# Patient Record
Sex: Female | Born: 1980 | Hispanic: No | Marital: Married | Smoking: Never smoker
Health system: Southern US, Community
[De-identification: ages and names within clinical notes are randomized; demographics above are authoritative.]

---

## 2008-11-10 ENCOUNTER — Encounter: Admission: RE | Admit: 2008-11-10 | Discharge: 2008-11-10 | Payer: Self-pay | Admitting: Obstetrics and Gynecology

## 2010-07-07 ENCOUNTER — Encounter: Admission: RE | Admit: 2010-07-07 | Discharge: 2010-07-07 | Payer: Self-pay | Admitting: Obstetrics and Gynecology

## 2011-02-02 ENCOUNTER — Other Ambulatory Visit: Payer: Self-pay | Admitting: Obstetrics and Gynecology

## 2011-02-02 DIAGNOSIS — N63 Unspecified lump in unspecified breast: Secondary | ICD-10-CM

## 2011-02-10 ENCOUNTER — Ambulatory Visit
Admission: RE | Admit: 2011-02-10 | Discharge: 2011-02-10 | Disposition: A | Discharge status: Home / Self Care | Payer: BC Managed Care – PPO | Source: Ambulatory Visit | Attending: Obstetrics and Gynecology | Admitting: Obstetrics and Gynecology

## 2011-02-10 DIAGNOSIS — N63 Unspecified lump in unspecified breast: Secondary | ICD-10-CM

## 2013-04-11 ENCOUNTER — Ambulatory Visit: Payer: Self-pay | Admitting: Internal Medicine

## 2013-04-19 ENCOUNTER — Ambulatory Visit (INDEPENDENT_AMBULATORY_CARE_PROVIDER_SITE_OTHER): Payer: BC Managed Care – PPO | Admitting: Internal Medicine

## 2013-04-19 ENCOUNTER — Encounter: Payer: Self-pay | Admitting: Internal Medicine

## 2013-04-19 VITALS — BP 120/60 | HR 64 | Temp 98.2°F | Resp 16 | Ht 63.0 in | Wt 115.0 lb

## 2013-04-19 DIAGNOSIS — R490 Dysphonia: Secondary | ICD-10-CM | POA: Insufficient documentation

## 2013-04-19 DIAGNOSIS — R109 Unspecified abdominal pain: Secondary | ICD-10-CM | POA: Insufficient documentation

## 2013-04-19 DIAGNOSIS — Z0000 Encounter for general adult medical examination without abnormal findings: Secondary | ICD-10-CM

## 2013-04-19 MED ORDER — FOLIC ACID 400 MCG PO TABS
400.0000 ug | ORAL_TABLET | Freq: Every day | ORAL | Status: DC
Start: 2013-04-19 — End: 2014-04-23

## 2013-04-19 MED ORDER — LORATADINE 10 MG PO TABS: 10.0000 mg | ORAL_TABLET | Freq: Every day | ORAL | Status: DC

## 2013-04-19 MED ORDER — RANITIDINE HCL 150 MG PO TABS: 150.0000 mg | ORAL_TABLET | Freq: Every day | ORAL | Status: DC

## 2013-04-19 MED ORDER — VITAMIN D 1000 UNITS PO TABS: 1000.0000 [IU] | ORAL_TABLET | Freq: Every day | ORAL | Status: AC

## 2013-04-19 NOTE — Assessment & Plan Note (Signed)
Voice rest prn Drink more water Empiric loratidine or ranitidine ENT consult was offered

## 2013-04-19 NOTE — Patient Instructions (Addendum)
Gluten free trial (no wheat products) x4-6 weeks. OK to use Gluten-free bread and pasta. Milk free trial (no milk, ice cream and yogurt) x4 weeks. OK to use almond or soy milk. 

## 2013-04-19 NOTE — Progress Notes (Signed)
  Subjective:    Patient ID: Robin Graham, female    DOB: May 23, 1981, 32 y.o.   MRN: [id number]  Abdominal Pain This is a new problem. The current episode started more than 1 month ago (6 months). The onset quality is gradual. The problem occurs intermittently. The problem has been gradually improving (80% better). The pain is located in the RLQ and suprapubic region. The pain is severe. The quality of the pain is sharp. The abdominal pain does not radiate. Pertinent negatives include no anorexia, arthralgias, constipation, diarrhea, dysuria, fever, flatus, frequency, headaches, hematochezia, hematuria, melena, nausea or weight loss. Exacerbated by: intercourse. The pain is relieved by being still. Treatments tried: she was treated by her GYN for yeast infection. The treatment provided significant relief. There is no history of abdominal surgery, Crohn's disease, gallstones, GERD, irritable bowel syndrome, pancreatitis, PUD or ulcerative colitis.    New pt  C/o hoarseness C/o pain in lower abd x6 mo  Review of Systems  Constitutional: Negative for fever, chills, weight loss, activity change, appetite change, fatigue and unexpected weight change.  HENT: Positive for voice change. Negative for nosebleeds, congestion, sore throat, rhinorrhea, sneezing, mouth sores, trouble swallowing and sinus pressure.        Hoarsness x 6 months  Eyes: Negative for visual disturbance.  Respiratory: Negative for cough and chest tightness.   Gastrointestinal: Positive for abdominal pain. Negative for nausea, diarrhea, constipation, melena, hematochezia, anorexia and flatus.  Genitourinary: Positive for pelvic pain. Negative for dysuria, urgency, frequency, hematuria, flank pain, vaginal bleeding, vaginal discharge, difficulty urinating and vaginal pain.  Musculoskeletal: Negative for back pain, arthralgias and gait problem.  Skin: Negative for pallor and rash.  Neurological: Negative for dizziness, tremors,  syncope, weakness, numbness and headaches.  Psychiatric/Behavioral: Negative for confusion and sleep disturbance. The patient is not nervous/anxious and is not hyperactive.        Objective:   Physical Exam  Constitutional: She appears well-developed. No distress.  HENT:  Head: Normocephalic.  Right Ear: External ear normal.  Left Ear: External ear normal.  Nose: Nose normal.  Mouth/Throat: Oropharynx is clear and moist. No oropharyngeal exudate.  Dry mouth, a little hoarse  Eyes: Conjunctivae are normal. Pupils are equal, round, and reactive to light. Right eye exhibits no discharge. Left eye exhibits no discharge.  Neck: Normal range of motion. Neck supple. No JVD present. No tracheal deviation present. No thyromegaly present.  Cardiovascular: Normal rate, regular rhythm and normal heart sounds.   Pulmonary/Chest: No stridor. No respiratory distress. She has no wheezes.  Abdominal: Soft. Bowel sounds are normal. She exhibits no distension and no mass. There is tenderness. There is no rebound and no guarding.  Sensitive in RLQ and below umbilicus - mild No mass  Musculoskeletal: She exhibits no edema and no tenderness.  Lymphadenopathy:    She has no cervical adenopathy.  Neurological: She displays normal reflexes. No cranial nerve deficit. She exhibits normal muscle tone. Coordination normal.  Skin: No rash noted. No erythema.  Psychiatric: She has a normal mood and affect. Her behavior is normal. Judgment and thought content normal.          Assessment & Plan:

## 2013-04-19 NOTE — Assessment & Plan Note (Signed)
8/14 x6 months, now is 80% better ?etiology S/p GYN exam Labs ordered Transvag US vs abd CT discussed RTC 108mo - call if not well

## 2013-05-10 ENCOUNTER — Other Ambulatory Visit (INDEPENDENT_AMBULATORY_CARE_PROVIDER_SITE_OTHER): Payer: BC Managed Care – PPO

## 2013-05-10 DIAGNOSIS — Z0000 Encounter for general adult medical examination without abnormal findings: Secondary | ICD-10-CM

## 2013-05-10 DIAGNOSIS — R490 Dysphonia: Secondary | ICD-10-CM

## 2013-05-10 DIAGNOSIS — R109 Unspecified abdominal pain: Secondary | ICD-10-CM

## 2013-05-10 LAB — HEPATIC FUNCTION PANEL
Albumin: 3.9 g/dL (ref 3.5–5.2)
Alkaline Phosphatase: 48 U/L (ref 39–117)
Bilirubin, Direct: 0.1 mg/dL (ref 0.0–0.3)
Total Bilirubin: 1 mg/dL (ref 0.3–1.2)

## 2013-05-10 LAB — SEDIMENTATION RATE: Sed Rate: 12 mm/hr (ref 0–22)

## 2013-05-10 LAB — BASIC METABOLIC PANEL
BUN: 8 mg/dL (ref 6–23)
CO2: 26 mEq/L (ref 19–32)
Chloride: 106 mEq/L (ref 96–112)
Creatinine, Ser: 0.9 mg/dL (ref 0.4–1.2)
Glucose, Bld: 86 mg/dL (ref 70–99)

## 2013-05-10 LAB — CBC WITH DIFFERENTIAL/PLATELET
Basophils Relative: 0.7 % (ref 0.0–3.0)
Eosinophils Absolute: 0.3 10*3/uL (ref 0.0–0.7)
MCHC: 34 g/dL (ref 30.0–36.0)
MCV: 84.2 fl (ref 78.0–100.0)
Monocytes Absolute: 0.6 10*3/uL (ref 0.1–1.0)
Neutrophils Relative %: 52 % (ref 43.0–77.0)
RBC: 4.24 Mil/uL (ref 3.87–5.11)
RDW: 14.6 % (ref 11.5–14.6)

## 2013-05-10 LAB — URINALYSIS, ROUTINE W REFLEX MICROSCOPIC
Bilirubin Urine: NEGATIVE
Ketones, ur: NEGATIVE
Total Protein, Urine: NEGATIVE
pH: 6 (ref 5.0–8.0)

## 2013-05-10 LAB — LIPID PANEL
LDL Cholesterol: 120 mg/dL — ABNORMAL HIGH (ref 0–99)
Total CHOL/HDL Ratio: 3

## 2013-05-14 ENCOUNTER — Ambulatory Visit (INDEPENDENT_AMBULATORY_CARE_PROVIDER_SITE_OTHER): Payer: BC Managed Care – PPO | Admitting: Internal Medicine

## 2013-05-14 ENCOUNTER — Encounter: Payer: Self-pay | Admitting: Internal Medicine

## 2013-05-14 ENCOUNTER — Ambulatory Visit (INDEPENDENT_AMBULATORY_CARE_PROVIDER_SITE_OTHER)
Admission: RE | Admit: 2013-05-14 | Discharge: 2013-05-14 | Disposition: A | Discharge status: Home / Self Care | Payer: BC Managed Care – PPO | Source: Ambulatory Visit | Attending: Internal Medicine | Admitting: Internal Medicine

## 2013-05-14 VITALS — BP 120/88 | HR 72 | Resp 16 | Wt 114.0 lb

## 2013-05-14 DIAGNOSIS — M545 Low back pain, unspecified: Secondary | ICD-10-CM | POA: Insufficient documentation

## 2013-05-14 DIAGNOSIS — M546 Pain in thoracic spine: Secondary | ICD-10-CM | POA: Insufficient documentation

## 2013-05-14 MED ORDER — IBUPROFEN 600 MG PO TABS: 600.0000 mg | ORAL_TABLET | Freq: Two times a day (BID) | ORAL | Status: DC | PRN

## 2013-05-14 MED ORDER — CYCLOBENZAPRINE HCL 5 MG PO TABS: 2.5000 mg | ORAL_TABLET | Freq: Three times a day (TID) | ORAL | Status: DC | PRN

## 2013-05-14 NOTE — Assessment & Plan Note (Addendum)
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 

## 2013-05-14 NOTE — Progress Notes (Signed)
Subjective:   C/o LS and thoracic back pain since 32 y.o started at the [facility] when dancing instructions got much more intense...   Back Pain This is a recurrent problem. The current episode started more than 1 year ago (since 32 yo). The problem occurs daily. The problem has been waxing and waning since onset. The pain is present in the lumbar spine and thoracic spine. The quality of the pain is described as cramping, stabbing and burning. The pain does not radiate. The pain is at a severity of 7/10. The pain is severe. The pain is worse during the day. The symptoms are aggravated by bending, twisting and standing. Associated symptoms include abdominal pain and pelvic pain. Pertinent negatives include no dysuria, fever, headaches, numbness, weakness or weight loss. She has tried chiropractic manipulation (massage) for the symptoms. The treatment provided mild (rest helps) relief.  Abdominal Pain This is a new problem. The current episode started more than 1 month ago (6 months). The onset quality is gradual. The problem occurs intermittently. The problem has been gradually improving (80% better). The pain is located in the RLQ and suprapubic region. The pain is severe. The quality of the pain is sharp. The abdominal pain does not radiate. Pertinent negatives include no anorexia, arthralgias, constipation, diarrhea, dysuria, fever, flatus, frequency, headaches, hematochezia, hematuria, melena, nausea or weight loss. Exacerbated by: intercourse. The pain is relieved by being still. Treatments tried: she was treated by her GYN for yeast infection. The treatment provided significant relief. There is no history of abdominal surgery, Crohn's disease, gallstones, GERD, irritable bowel syndrome, pancreatitis, PUD or ulcerative colitis.     Review of Systems  Constitutional: Negative for fever, chills, weight loss, activity change, appetite change, fatigue and unexpected weight change.  HENT: Positive  for voice change. Negative for nosebleeds, congestion, sore throat, rhinorrhea, sneezing, mouth sores, trouble swallowing and sinus pressure.        Hoarsness x 6 months  Eyes: Negative for visual disturbance.  Respiratory: Negative for cough and chest tightness.   Gastrointestinal: Positive for abdominal pain. Negative for nausea, diarrhea, constipation, melena, hematochezia, anorexia and flatus.  Genitourinary: Positive for pelvic pain. Negative for dysuria, urgency, frequency, hematuria, flank pain, vaginal bleeding, vaginal discharge, difficulty urinating and vaginal pain.  Musculoskeletal: Positive for back pain. Negative for arthralgias and gait problem.  Skin: Negative for pallor and rash.  Neurological: Negative for dizziness, tremors, syncope, weakness, numbness and headaches.  Psychiatric/Behavioral: Negative for confusion and sleep disturbance. The patient is not nervous/anxious and is not hyperactive.        Objective:   Physical Exam  Constitutional: She appears well-developed. No distress.  HENT:  Head: Normocephalic.  Right Ear: External ear normal.  Left Ear: External ear normal.  Nose: Nose normal.  Mouth/Throat: Oropharynx is clear and moist. No oropharyngeal exudate.  Dry mouth, a little hoarse  Eyes: Conjunctivae are normal. Pupils are equal, round, and reactive to light. Right eye exhibits no discharge. Left eye exhibits no discharge.  Neck: Normal range of motion. Neck supple. No JVD present. No tracheal deviation present. No thyromegaly present.  Cardiovascular: Normal rate, regular rhythm and normal heart sounds.   Pulmonary/Chest: No stridor. No respiratory distress. She has no wheezes.  Abdominal: Soft. Bowel sounds are normal. She exhibits no distension and no mass. There is no tenderness. There is no rebound and no guarding.  Sensitive in RLQ and below umbilicus - mild No mass  Musculoskeletal: She exhibits tenderness. She exhibits no edema.  Thor and LS  spine are tender over the middle and laterally Full ROM  Lymphadenopathy:    She has no cervical adenopathy.  Neurological: She displays normal reflexes. No cranial nerve deficit. She exhibits normal muscle tone. Coordination normal.  Skin: No rash noted. No erythema.  Psychiatric: She has a normal mood and affect. Her behavior is normal. Judgment and thought content normal.          Assessment & Plan:

## 2013-05-14 NOTE — Assessment & Plan Note (Addendum)
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

## 2013-05-14 NOTE — Patient Instructions (Addendum)
[facility]

## 2013-05-20 ENCOUNTER — Ambulatory Visit (INDEPENDENT_AMBULATORY_CARE_PROVIDER_SITE_OTHER): Payer: BC Managed Care – PPO | Admitting: Family Medicine

## 2013-05-20 ENCOUNTER — Encounter: Payer: Self-pay | Admitting: Family Medicine

## 2013-05-20 VITALS — BP 98/74 | HR 53 | Wt 116.0 lb

## 2013-05-20 DIAGNOSIS — M999 Biomechanical lesion, unspecified: Secondary | ICD-10-CM

## 2013-05-20 DIAGNOSIS — M546 Pain in thoracic spine: Secondary | ICD-10-CM

## 2013-05-20 DIAGNOSIS — M9981 Other biomechanical lesions of cervical region: Secondary | ICD-10-CM

## 2013-05-20 DIAGNOSIS — M545 Low back pain, unspecified: Secondary | ICD-10-CM

## 2013-05-20 NOTE — Progress Notes (Signed)
  I'm seeing this patient by the request  of:  Dr. Posey Rea.  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 Patient states that the pain is a daily occurrence. Patient states that the severity can change going from her from 3/10-7/10. Patient describes the pain is more of a dull aching sensation that can have a stabbing and burning character to it. Patient denies any radiation around the chest denies any radiation down the lower Ms. Patient states it seems to get worse during the day especially during dancing. Patient states certain movements such as bending twisting and standing for a long amount of time can be painful. Patient denies any fever, chills, dysuria, abnormal menses, weakness in the large cavities or any abnormal weight loss. Patient has tried chiropractic manipulation, massage and over-the-counter medications without significant improvement.  Past medical, surgical, family and social history reviewed. Medications reviewed all in the electronic medical record.  Review of Systems: No headache, visual changes, nausea, vomiting, diarrhea, constipation, dizziness, abdominal pain, skin rash, fevers, chills, night sweats, weight loss, swollen lymph nodes, joint swelling,  chest pain, shortness of breath, mood changes.   Objective:    Blood pressure 98/74, pulse 53, weight 116 lb (52.617 kg), last menstrual period 05/14/2013, SpO2 97.00%.   General: No apparent distress alert and oriented x3 mood and affect normal, dressed appropriately.  HEENT: Pupils equal, extraocular movements intact Respiratory: Patient's speak in full sentences and does not appear short of breath Cardiovascular: No lower extremity edema, non tender, no erythema Skin: Warm dry intact with no signs of infection or rash on extremities or on axial skeleton. Abdomen: Soft nontender Neuro:  Cranial nerves II through XII are intact, neurovascularly intact in all extremities with 2+ DTRs and 2+ pulses. Lymph: No lymphadenopathy of posterior or anterior cervical chain or axillae bilaterally.  Gait normal with good balance and coordination.  MSK: Non tender with full range of motion and good stability and symmetric strength and tone of shoulders, elbows, wrist, hip, knee and ankles bilaterally.  Back Exam:  Inspection: Unremarkable  Motion: Flexion 45 deg, Extension 45 deg, Side Bending to 45 deg bilaterally,  Rotation to 45 deg bilaterally  SLR laying: Negative  XSLR laying: Negative  Palpable tenderness: None. FABER: negative. Sensory change: Gross sensation intact to all lumbar and sacral dermatomes.  Reflexes: 2+ at both patellar tendons, 2+ at achilles tendons, Babinski's downgoing.  Strength at foot  Plantar-flexion: 5/5 Dorsi-flexion: 5/5 Eversion: 5/5 Inversion: 5/5  Leg strength  Quad: 5/5 Hamstring: 5/5 Hip flexor: 5/5 Hip abductors: 5/5  Gait unremarkable.  OMT Physical Exam  Standing structural       Occiput nuetral  Shoulder left higher  Inferior angle of scapula left higher  Illiac crest right higher  Medial malleolus nuetral  Standing flexion nuetral  Seated Flexion right  Cervical  C4 F RS right  Thoracic T1 E RS right, with elevated first rib T4 E RS left  Lumbar L2 F RS left  Sacrum Right on right  Illium nuetral    Impression and Recommendations:     This case required medical decision making of moderate complexity.

## 2013-05-20 NOTE — Assessment & Plan Note (Signed)
Exposure reviewed and interpreted by me today. Patient has normal spine alignment. I think this is more secondary to patient's occupation. Patient does have some muscle imbalances with upper thoracic spine musculature being weaker than middle back. Patient given exercises today and did respond well to manipulation. Patient will have vitamin D as well as fish oil to her regular regimen to Discussed the importance of stretching See patient back again in 3 weeks.

## 2013-05-20 NOTE — Assessment & Plan Note (Signed)
Decision today to treat with OMT was based on Physical Exam  After verbal consent patient was treated with HVLA and ME techniques in cervical, thoracic and lumbar areas  Patient tolerated the procedure well with improvement in symptoms  Patient given exercises, stretches and lifestyle modifications  Recommended fish oil and vitamin D. Patient would like to avoid prescription medications.   Patient will follow up in 3 weeks

## 2013-05-20 NOTE — Assessment & Plan Note (Signed)
Decision today to treat with OMT was based on Physical Exam  After verbal consent patient was treated with HVLA and ME techniques in cervical, thoracic and lumbar areas  Patient tolerated the procedure well with improvement in symptoms  Patient given exercises, stretches and lifestyle modifications  Recommended fish oil and vitamin D. Patient would like to avoid prescription medications.   Patient will follow up in 3 weeks  

## 2013-05-20 NOTE — Assessment & Plan Note (Signed)
Multifactorial and mostly muscular in nature. Patient has stronger lower back which is likely compensating for her weaker midback. Patient given exercises as well as core strengthening exercises that could be beneficial. Discussed hip flexors and keeping them stretched. Patient will start fish oil and vitamin D. Responded well to osteopathic manipulation Will return in 3 weeks.

## 2013-05-20 NOTE — Patient Instructions (Signed)
Very nice to meet you Focus on strengthen the upper back  Do the 5# weight at 45 degree bend and pull back to pinch shoulder blades Sacroiliac Joint Mobilization and Rehab 1. Work on pretzel stretching, shoulder back and leg draped in front. 3-5 sets, 30 sec.. 2. hip abductor rotations. standing, hip flexion and rotation outward then inward. 3 sets, 15 reps. when can do comfortably, add ankle weights starting at 2 pounds.  3. cross over stretching - shoulder back to ground, same side leg crossover. 3-5 sets for 30 min..  4. rolling up and back knees to chest and rocking. 5. sacral tilt - 5 sets, hold for 5-10 seconds Also focus on your hip flexors Focus on core stability training Fish oil 2-3grams daily Vit D 1000IU daily Come back and see me again in 3 weeks.

## 2013-06-10 ENCOUNTER — Encounter: Payer: Self-pay | Admitting: Family Medicine

## 2013-06-10 ENCOUNTER — Ambulatory Visit (INDEPENDENT_AMBULATORY_CARE_PROVIDER_SITE_OTHER): Payer: BC Managed Care – PPO | Admitting: Family Medicine

## 2013-06-10 VITALS — BP 100/72 | HR 58 | Wt 117.0 lb

## 2013-06-10 DIAGNOSIS — M545 Low back pain, unspecified: Secondary | ICD-10-CM

## 2013-06-10 DIAGNOSIS — M546 Pain in thoracic spine: Secondary | ICD-10-CM

## 2013-06-10 DIAGNOSIS — M9981 Other biomechanical lesions of cervical region: Secondary | ICD-10-CM

## 2013-06-10 DIAGNOSIS — M999 Biomechanical lesion, unspecified: Secondary | ICD-10-CM

## 2013-06-10 NOTE — Assessment & Plan Note (Signed)
Decision today to treat with OMT was based on Physical Exam  After verbal consent patient was treated with HVLA, ME techniques in cervical, thoracic, lumbar and sacrum areas  Patient tolerated the procedure well with improvement in symptoms  Patient given exercises, stretches and lifestyle modifications  See medications in patient instructions if given  Patient will follow up in 3-4 weeks  

## 2013-06-10 NOTE — Assessment & Plan Note (Signed)
Patient is still having muscular skeletal complaints. Patient is not doing the exercises on a regular basis or taking the tissue well or vitamin D. Patient told that she needs to do this on a regular basis at least 3 times a week. Patient is responding to osteopathic manipulation. Patient will come back again in 3-4 weeks for further evaluation

## 2013-06-10 NOTE — Patient Instructions (Signed)
It is good to see you Continue the exercises at least 3 times a week.  Yoga would be good I do think it would be good to se eyou again in 3-4 weeks.  Be patient this did not start over night and it will not get better overnight.

## 2013-06-10 NOTE — Progress Notes (Signed)
  Subjective:    CC: Thoracic and low back pain  HPI: Patient is a very pleasant 32 year old female: Upper thoracic and low back pain. 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 Patient did have osteopathic manipulation and did respond well. Patient's was given home exercises focusing on stretching and strengthening of the core. Patient states that she is better. Patient feels that she did respond well to the osteopathic manipulation. 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 Patient denies any new symptoms such as radiation of pain, any numbness or tingling. Patient still says the pain mostly in her neck and back seem to be worse. This does not keep her up at night. Patient did start to fish oil and vitamin D. but not on a regular basis.  Past medical history, Surgical history, Family history not pertinant except as noted below, Social history, Allergies, and medications have been entered into the medical record, reviewed, and no changes needed.   Review of Systems: No fevers, chills, night sweats, weight loss, chest pain, or shortness of breath.   Objective:   Blood pressure 100/72, pulse 58, weight 117 lb (53.071 kg), last menstrual period 05/14/2013.  General: Well Developed, well nourished, and in no acute distress.  Neuro: Alert and oriented x3, extra-ocular muscles intact, sensation grossly intact.  HEENT: Normocephalic, atraumatic, pupils equal round reactive to light, neck supple, no masses, no lymphadenopathy, thyroid nonpalpable.  Skin: Warm and dry, no rashes. Cardiac:  no lower extremity edema. Respiratory: Not using accessory muscles, speaking in full sentences. Abdominal: NT, soft Gait: Nonantlagic, good balance and coordination Lymphatic: no lymphadenopathy in neck or axillae on palpation, non tender.  Musculoskeletal: Inspection and palpation of the right and left upper extremities including the shoulders elbows  and wrist are unremarkable with full range of motion and good muscle strength and tone. Inspection and palpation of the right and left lower extremities including the hips knees and ankles are unremarkable and nontender with full range of motion and good muscle strength and tone and are symmetric.  Back Exam:  Inspection: Unremarkable  Motion: Flexion 45 deg, Extension 45 deg, Side Bending to 45 deg bilaterally,  Rotation to 45 deg bilaterally  SLR laying: Negative  XSLR laying: Negative  Palpable tenderness: None.  FABER: negative.  Sensory change: Gross sensation intact to all lumbar and sacral dermatomes.  Reflexes: 2+ at both patellar tendons, 2+ at achilles tendons, Babinski's downgoing.  Strength at foot  Plantar-flexion: 5/5 Dorsi-flexion: 5/5 Eversion: 5/5 Inversion: 5/5  Leg strength  Quad: 5/5 Hamstring: 5/5 Hip flexor: 5/5 Hip abductors: 5/5  Gait unremarkable.  Neck: Inspection unremarkable. No palpable stepoffs. Negative Spurling's maneuver. Full neck range of motion Grip strength and sensation normal in bilateral hands Strength good C4 to T1 distribution No sensory change to C4 to T1 Negative Hoffman sign bilaterally Reflexes normal  OMT Physical Exam  Standing structural  Occiput nuetral  Shoulder left higher  Inferior angle of scapula left higher  Illiac crest right higher  Medial malleolus nuetral  Standing flexion nuetral  Seated Flexion right  Cervical  C2 F RS left C4 F RS right   Thoracic  T1 E RS right, T4 E RS left   Lumbar  L2 F RS left   Sacrum  Right on right   Illium  nuetral    Impression and Recommendations:

## 2013-06-10 NOTE — Assessment & Plan Note (Signed)
Still secondary to tight hip flexors. Patient shown proper technique of the stretching again. Patient will follow up again in 3-4 weeks for further evaluation.

## 2013-06-10 NOTE — Assessment & Plan Note (Signed)
Decision today to treat with OMT was based on Physical Exam  After verbal consent patient was treated with HVLA, ME techniques in cervical, thoracic, lumbar and sacrum areas  Patient tolerated the procedure well with improvement in symptoms  Patient given exercises, stretches and lifestyle modifications  See medications in patient instructions if given  Patient will follow up in 3-4 weeks

## 2013-08-14 ENCOUNTER — Encounter: Payer: Self-pay | Admitting: Internal Medicine

## 2013-08-14 ENCOUNTER — Ambulatory Visit (INDEPENDENT_AMBULATORY_CARE_PROVIDER_SITE_OTHER): Payer: BC Managed Care – PPO | Admitting: Internal Medicine

## 2013-08-14 VITALS — BP 108/80 | HR 68 | Temp 98.2°F | Resp 16 | Wt 117.0 lb

## 2013-08-14 DIAGNOSIS — M545 Low back pain, unspecified: Secondary | ICD-10-CM

## 2013-08-14 DIAGNOSIS — R490 Dysphonia: Secondary | ICD-10-CM

## 2013-08-14 DIAGNOSIS — R109 Unspecified abdominal pain: Secondary | ICD-10-CM

## 2013-08-14 NOTE — Assessment & Plan Note (Signed)
ENT consult if problems

## 2013-08-14 NOTE — Assessment & Plan Note (Signed)
F/u w/Dr Smith 

## 2013-08-14 NOTE — Progress Notes (Signed)
Subjective:   F/u LS and thoracic back pain (since 32 y.o started at the [facility] when dancing instructions got much more intense); better after Dr Michaelle Copas treatments  Wt Readings from Last 3 Encounters:  08/14/13 117 lb (53.071 kg)  06/10/13 117 lb (53.071 kg)  05/20/13 116 lb (52.617 kg)   BP Readings from Last 3 Encounters:  08/14/13 108/80  06/10/13 100/72  05/20/13 98/74      Back Pain This is a recurrent problem. The current episode started more than 1 year ago (since 32 yo). The problem occurs daily. The problem has been waxing and waning since onset. The pain is present in the lumbar spine and thoracic spine. The quality of the pain is described as cramping, stabbing and burning. The pain does not radiate. The pain is at a severity of 7/10. The pain is severe. The pain is worse during the day. The symptoms are aggravated by bending, twisting and standing. Associated symptoms include abdominal pain and pelvic pain. Pertinent negatives include no dysuria, fever, headaches, numbness, weakness or weight loss. She has tried chiropractic manipulation (massage) for the symptoms. The treatment provided mild (rest helps) relief.  Abdominal Pain This is a new problem. The current episode started more than 1 month ago (6 months). The onset quality is gradual. The problem occurs intermittently. The problem has been gradually improving (80% better). The pain is located in the RLQ and suprapubic region. The pain is severe. The quality of the pain is sharp. The abdominal pain does not radiate. Pertinent negatives include no anorexia, arthralgias, constipation, diarrhea, dysuria, fever, flatus, frequency, headaches, hematochezia, hematuria, melena, nausea or weight loss. Exacerbated by: intercourse. The pain is relieved by being still. Treatments tried: she was treated by her GYN for yeast infection. The treatment provided significant relief. There is no history of abdominal surgery, Crohn's  disease, gallstones, GERD, irritable bowel syndrome, pancreatitis, PUD or ulcerative colitis.     Review of Systems  Constitutional: Negative for fever, chills, weight loss, activity change, appetite change, fatigue and unexpected weight change.  HENT: Positive for voice change. Negative for congestion, mouth sores, nosebleeds, rhinorrhea, sinus pressure, sneezing, sore throat and trouble swallowing.        Hoarsness x 6 months  Eyes: Negative for visual disturbance.  Respiratory: Negative for cough and chest tightness.   Gastrointestinal: Positive for abdominal pain. Negative for nausea, diarrhea, constipation, melena, hematochezia, anorexia and flatus.  Genitourinary: Positive for pelvic pain. Negative for dysuria, urgency, frequency, hematuria, flank pain, vaginal bleeding, vaginal discharge, difficulty urinating and vaginal pain.  Musculoskeletal: Positive for back pain. Negative for arthralgias and gait problem.  Skin: Negative for pallor and rash.  Neurological: Negative for dizziness, tremors, syncope, weakness, numbness and headaches.  Psychiatric/Behavioral: Negative for confusion and sleep disturbance. The patient is not nervous/anxious and is not hyperactive.        Objective:   Physical Exam  Constitutional: She appears well-developed. No distress.  HENT:  Head: Normocephalic.  Right Ear: External ear normal.  Left Ear: External ear normal.  Nose: Nose normal.  Mouth/Throat: Oropharynx is clear and moist. No oropharyngeal exudate.  Dry mouth, a little hoarse  Eyes: Conjunctivae are normal. Pupils are equal, round, and reactive to light. Right eye exhibits no discharge. Left eye exhibits no discharge.  Neck: Normal range of motion. Neck supple. No JVD present. No tracheal deviation present. No thyromegaly present.  Cardiovascular: Normal rate, regular rhythm and normal heart sounds.   Pulmonary/Chest: No stridor. No respiratory  distress. She has no wheezes.  Abdominal:  Soft. Bowel sounds are normal. She exhibits no distension and no mass. There is no tenderness. There is no rebound and no guarding.  Sensitive in RLQ and below umbilicus - mild No mass  Musculoskeletal: She exhibits tenderness. She exhibits no edema.  Thor and LS spine are tender over the middle and laterally Full ROM  Lymphadenopathy:    She has no cervical adenopathy.  Neurological: She displays normal reflexes. No cranial nerve deficit. She exhibits normal muscle tone. Coordination normal.  Skin: No rash noted. No erythema.  Psychiatric: She has a normal mood and affect. Her behavior is normal. Judgment and thought content normal.    Lab Results  Component Value Date   WBC 6.2 05/10/2013   HGB 12.1 05/10/2013   HCT 35.6* 05/10/2013   PLT 165.0 05/10/2013   GLUCOSE 86 05/10/2013   CHOL 196 05/10/2013   TRIG 68.0 05/10/2013   HDL 62.30 05/10/2013   LDLCALC 120* 05/10/2013   ALT 20 05/10/2013   AST 23 05/10/2013   NA 137 05/10/2013   K 3.7 05/10/2013   CL 106 05/10/2013   CREATININE 0.9 05/10/2013   BUN 8 05/10/2013   CO2 26 05/10/2013   TSH 2.93 05/10/2013        Assessment & Plan:

## 2013-08-14 NOTE — Patient Instructions (Signed)
   Milk free trial (no milk, ice cream, cheese and yogurt) for 4-6 weeks. OK to use almond, coconut, rice or soy milk. "Almond breeze" brand tastes good.  

## 2013-08-14 NOTE — Assessment & Plan Note (Signed)
Better  

## 2013-08-14 NOTE — Progress Notes (Signed)
Pre visit review using our clinic review tool, if applicable. No additional management support is needed unless otherwise documented below in the visit note. 

## 2013-10-21 ENCOUNTER — Ambulatory Visit: Payer: BC Managed Care – PPO | Admitting: Internal Medicine

## 2013-11-27 ENCOUNTER — Encounter: Payer: Self-pay | Admitting: Internal Medicine

## 2013-11-27 ENCOUNTER — Ambulatory Visit (INDEPENDENT_AMBULATORY_CARE_PROVIDER_SITE_OTHER): Payer: BC Managed Care – PPO | Admitting: Internal Medicine

## 2013-11-27 ENCOUNTER — Other Ambulatory Visit: Payer: Self-pay | Admitting: *Deleted

## 2013-11-27 ENCOUNTER — Other Ambulatory Visit (INDEPENDENT_AMBULATORY_CARE_PROVIDER_SITE_OTHER): Payer: BC Managed Care – PPO

## 2013-11-27 VITALS — BP 98/76 | HR 72 | Temp 98.1°F | Resp 16 | Wt 117.0 lb

## 2013-11-27 DIAGNOSIS — R102 Pelvic and perineal pain: Secondary | ICD-10-CM

## 2013-11-27 DIAGNOSIS — R109 Unspecified abdominal pain: Secondary | ICD-10-CM

## 2013-11-27 DIAGNOSIS — N949 Unspecified condition associated with female genital organs and menstrual cycle: Secondary | ICD-10-CM

## 2013-11-27 LAB — CBC WITH DIFFERENTIAL/PLATELET
BASOS ABS: 0 10*3/uL (ref 0.0–0.1)
Basophils Relative: 0.6 % (ref 0.0–3.0)
EOS ABS: 0.2 10*3/uL (ref 0.0–0.7)
Eosinophils Relative: 4.2 % (ref 0.0–5.0)
HEMATOCRIT: 37.7 % (ref 36.0–46.0)
HEMOGLOBIN: 12.5 g/dL (ref 12.0–15.0)
LYMPHS ABS: 1.7 10*3/uL (ref 0.7–4.0)
Lymphocytes Relative: 33 % (ref 12.0–46.0)
MCHC: 33.2 g/dL (ref 30.0–36.0)
MCV: 84.9 fl (ref 78.0–100.0)
MONO ABS: 0.5 10*3/uL (ref 0.1–1.0)
MONOS PCT: 9.1 % (ref 3.0–12.0)
NEUTROS ABS: 2.7 10*3/uL (ref 1.4–7.7)
Neutrophils Relative %: 53.1 % (ref 43.0–77.0)
PLATELETS: 180 10*3/uL (ref 150.0–400.0)
RBC: 4.44 Mil/uL (ref 3.87–5.11)
RDW: 13.9 % (ref 11.5–14.6)
WBC: 5.1 10*3/uL (ref 4.5–10.5)

## 2013-11-27 LAB — BASIC METABOLIC PANEL
BUN: 16 mg/dL (ref 6–23)
CALCIUM: 9.4 mg/dL (ref 8.4–10.5)
CO2: 27 meq/L (ref 19–32)
CREATININE: 0.9 mg/dL (ref 0.4–1.2)
Chloride: 104 mEq/L (ref 96–112)
GFR: 80.17 mL/min (ref >60.00)
Glucose, Bld: 88 mg/dL (ref 70–99)
Potassium: 4.5 mEq/L (ref 3.5–5.1)
SODIUM: 136 meq/L (ref 135–145)

## 2013-11-27 LAB — URINALYSIS
BILIRUBIN URINE: NEGATIVE
HGB URINE DIPSTICK: NEGATIVE
KETONES UR: NEGATIVE
Leukocytes, UA: NEGATIVE
Nitrite: NEGATIVE
PH: 6 (ref 5.0–8.0)
Specific Gravity, Urine: 1.025 (ref 1.000–1.030)
TOTAL PROTEIN, URINE-UPE24: NEGATIVE
URINE GLUCOSE: NEGATIVE
Urobilinogen, UA: 0.2 (ref 0.0–1.0)

## 2013-11-27 LAB — SEDIMENTATION RATE: Sed Rate: 9 mm/hr (ref 0–22)

## 2013-11-27 MED ORDER — DOXYCYCLINE HYCLATE 100 MG PO TABS: 100.0000 mg | ORAL_TABLET | Freq: Two times a day (BID) | ORAL | Status: DC

## 2013-11-27 NOTE — Assessment & Plan Note (Signed)
8/14 x 6 months, GI related/epigastric - now is 80-90% better

## 2013-11-27 NOTE — Progress Notes (Signed)
Subjective:   C/o recurrent pelvic pain x2 years off and on L>R. Married x 3 years. Living together w/her husband x 4 years. Pain w/intercourse x2 years off and on. No fever. No sweats. 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  Previous epigastric/GI pain in the abdomen have resolved  F/u LS and thoracic back pain (since 33 [facility] much more intense); better after Dr Michaelle CopasSmith's treatments - better  Wt Readings from Last 3 Encounters:  11/27/13 117 lb (53.071 kg)  08/14/13 117 lb (53.071 kg)  06/10/13 117 lb (53.071 kg)   BP Readings from Last 3 Encounters:  11/27/13 98/76  08/14/13 108/80  06/10/13 100/72      HPI   Review of Systems  Constitutional: Negative for chills, activity change, appetite change, fatigue and unexpected weight change.  HENT: Positive for voice change. Negative for congestion, mouth sores, nosebleeds, rhinorrhea, sinus pressure, sneezing, sore throat and trouble swallowing.        Hoarsness x 6 months  Eyes: Negative for visual disturbance.  Respiratory: Negative for cough and chest tightness.   Gastrointestinal: Positive for abdominal pain. Negative for nausea and vomiting.  Genitourinary: Positive for vaginal pain and pelvic pain. Negative for urgency, flank pain, vaginal bleeding, vaginal discharge, difficulty urinating and menstrual problem.  Musculoskeletal: Positive for back pain (better). Negative for arthralgias and gait problem.  Skin: Negative for pallor and rash.  Neurological: Negative for dizziness, tremors and syncope.  Hematological: Does not bruise/bleed easily.  Psychiatric/Behavioral: Negative for suicidal ideas, behavioral problems, confusion, sleep disturbance and dysphoric mood. The patient is not nervous/anxious and is not hyperactive.        Objective:   Physical Exam  Constitutional: She appears well-developed. No distress.  HENT:  Head: Normocephalic.  Right Ear: External  ear normal.  Left Ear: External ear normal.  Nose: Nose normal.  Mouth/Throat: Oropharynx is clear and moist. No oropharyngeal exudate.  Dry mouth, a little hoarse  Eyes: Conjunctivae are normal. Pupils are equal, round, and reactive to light. Right eye exhibits no discharge. Left eye exhibits no discharge.  Neck: Normal range of motion. Neck supple. No JVD present. No tracheal deviation present. No thyromegaly present.  Cardiovascular: Normal rate, regular rhythm and normal heart sounds.   Pulmonary/Chest: No stridor. No respiratory distress. She has no wheezes.  Abdominal: Soft. Bowel sounds are normal. She exhibits no distension and no mass. There is tenderness (LLQ is sensitive). There is no rebound and no guarding.  Sensitive in RLQ and below umbilicus - mild No mass  Genitourinary:  NE  Musculoskeletal: She exhibits no edema and no tenderness.     Lymphadenopathy:    She has no cervical adenopathy.  Neurological: She displays normal reflexes. No cranial nerve deficit. She exhibits normal muscle tone. Coordination normal.  Skin: No rash noted. No erythema.  Psychiatric: She has a normal mood and affect. Her behavior is normal. Judgment and thought content normal.    Lab Results  Component Value Date   WBC 5.1 11/27/2013   HGB 12.5 11/27/2013   HCT 37.7 11/27/2013   PLT 180.0 11/27/2013   GLUCOSE 88 11/27/2013   CHOL 196 05/10/2013   TRIG 68.0 05/10/2013   HDL 62.30 05/10/2013   LDLCALC 120* 05/10/2013   ALT 20 05/10/2013   AST 23 05/10/2013   NA 136 11/27/2013   K 4.5 11/27/2013   CL 104 11/27/2013   CREATININE 0.9 11/27/2013   BUN 16 11/27/2013  CO2 27 11/27/2013   TSH 2.93 05/10/2013        Assessment & Plan:

## 2013-11-27 NOTE — Progress Notes (Signed)
Pre visit review using our clinic review tool, if applicable. No additional management support is needed unless otherwise documented below in the visit note. 

## 2013-11-27 NOTE — Assessment & Plan Note (Addendum)
Recurrent x2 years. Married x 3 years. Living together w/her husband x 4 years. Pain w/intercourse x2 years off and on. 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  Labs incl. Chlamydia/GC UA probe Doxy x3 wks Gyn consult Dr Juliene PinaMody

## 2013-11-27 NOTE — Progress Notes (Deleted)
  C/o recurrent pelvic pain x2 years off and on L>R. Married x 3 years. Living together w/her husband x 4 years. Pain w/intercourse x2 years off and on. No fever. No sweats. 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

## 2013-11-28 LAB — GC/CHLAMYDIA PROBE AMP, URINE
CHLAMYDIA, SWAB/URINE, PCR: NEGATIVE
GC Probe Amp, Urine: NEGATIVE

## 2013-12-25 ENCOUNTER — Ambulatory Visit: Payer: BC Managed Care – PPO | Admitting: Internal Medicine

## 2014-01-16 ENCOUNTER — Encounter: Payer: Self-pay | Admitting: Internal Medicine

## 2014-01-16 ENCOUNTER — Other Ambulatory Visit (INDEPENDENT_AMBULATORY_CARE_PROVIDER_SITE_OTHER): Payer: BC Managed Care – PPO

## 2014-01-16 ENCOUNTER — Ambulatory Visit (INDEPENDENT_AMBULATORY_CARE_PROVIDER_SITE_OTHER): Payer: BC Managed Care – PPO | Admitting: Internal Medicine

## 2014-01-16 ENCOUNTER — Ambulatory Visit: Payer: BC Managed Care – PPO

## 2014-01-16 VITALS — BP 94/78 | HR 56 | Temp 97.5°F | Ht 63.0 in | Wt 116.0 lb

## 2014-01-16 DIAGNOSIS — M545 Low back pain, unspecified: Secondary | ICD-10-CM

## 2014-01-16 DIAGNOSIS — N949 Unspecified condition associated with female genital organs and menstrual cycle: Secondary | ICD-10-CM

## 2014-01-16 DIAGNOSIS — R490 Dysphonia: Secondary | ICD-10-CM

## 2014-01-16 DIAGNOSIS — R109 Unspecified abdominal pain: Secondary | ICD-10-CM

## 2014-01-16 DIAGNOSIS — M546 Pain in thoracic spine: Secondary | ICD-10-CM

## 2014-01-16 DIAGNOSIS — R102 Pelvic and perineal pain: Secondary | ICD-10-CM

## 2014-01-16 LAB — CBC WITH DIFFERENTIAL/PLATELET
BASOS PCT: 1 % (ref 0.0–3.0)
Basophils Absolute: 0 10*3/uL (ref 0.0–0.1)
EOS ABS: 0.2 10*3/uL (ref 0.0–0.7)
Eosinophils Relative: 4.4 % (ref 0.0–5.0)
HEMATOCRIT: 37.3 % (ref 36.0–46.0)
HEMOGLOBIN: 12.5 g/dL (ref 12.0–15.0)
LYMPHS ABS: 1.7 10*3/uL (ref 0.7–4.0)
Lymphocytes Relative: 34.9 % (ref 12.0–46.0)
MCHC: 33.6 g/dL (ref 30.0–36.0)
MCV: 84.7 fl (ref 78.0–100.0)
MONO ABS: 0.4 10*3/uL (ref 0.1–1.0)
Monocytes Relative: 9 % (ref 3.0–12.0)
NEUTROS ABS: 2.4 10*3/uL (ref 1.4–7.7)
Neutrophils Relative %: 50.7 % (ref 43.0–77.0)
Platelets: 173 10*3/uL (ref 150.0–400.0)
RBC: 4.4 Mil/uL (ref 3.87–5.11)
RDW: 14.7 % (ref 11.5–15.5)
WBC: 4.7 10*3/uL (ref 4.0–10.5)

## 2014-01-16 LAB — VITAMIN B12: Vitamin B-12: 387 pg/mL (ref 211–911)

## 2014-01-16 LAB — HEPATIC FUNCTION PANEL
ALBUMIN: 4.3 g/dL (ref 3.5–5.2)
ALK PHOS: 43 U/L (ref 39–117)
ALT: 33 U/L (ref 0–35)
AST: 42 U/L — ABNORMAL HIGH (ref 0–37)
BILIRUBIN DIRECT: 0.1 mg/dL (ref 0.0–0.3)
TOTAL PROTEIN: 7.2 g/dL (ref 6.0–8.3)
Total Bilirubin: 0.8 mg/dL (ref 0.2–1.2)

## 2014-01-16 LAB — IBC PANEL
Iron: 130 ug/dL (ref 42–145)
Saturation Ratios: 26.8 % (ref 20.0–50.0)
Transferrin: 346.8 mg/dL (ref 212.0–360.0)

## 2014-01-16 LAB — SEDIMENTATION RATE: Sed Rate: 8 mm/hr (ref 0–22)

## 2014-01-16 LAB — TSH: TSH: 2.88 u[IU]/mL (ref 0.35–4.50)

## 2014-01-16 LAB — T4, FREE: Free T4: 0.92 ng/dL (ref 0.60–1.60)

## 2014-01-16 MED ORDER — MELOXICAM 7.5 MG PO TABS: 7.5000 mg | ORAL_TABLET | Freq: Every day | ORAL | Status: DC

## 2014-01-16 MED ORDER — DOXYCYCLINE HYCLATE 100 MG PO TABS: 100.0000 mg | ORAL_TABLET | Freq: Two times a day (BID) | ORAL | Status: DC

## 2014-01-16 MED ORDER — TRAMADOL HCL 50 MG PO TABS: 25.0000 mg | ORAL_TABLET | Freq: Two times a day (BID) | ORAL | Status: DC | PRN

## 2014-01-16 NOTE — Progress Notes (Signed)
Pre visit review using our clinic review tool, if applicable. No additional management support is needed unless otherwise documented below in the visit note. 

## 2014-01-16 NOTE — Assessment & Plan Note (Signed)
Continue with current prn prescription therapy as reflected on the Med list. Better

## 2014-01-16 NOTE — Assessment & Plan Note (Addendum)
60% better s/p pelvic US and GYN eval  Will repeat Doxy x 3 wks and Meloxicam 7.5 mg x 1-2 mo pc  Tramadol prn

## 2014-01-16 NOTE — Progress Notes (Signed)
Subjective:   F/u recurrent pelvic pain x2 years off and on L>R.  Married x 3 years. Living together w/her husband x 4 years. Pain w/intercourse x2 years off and on. No fever. No sweats. 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 Pain is 60% better after Doxy x 3 wks. She s/p recent nl transvaginal pelvic US and GYN evaluation. F/u occ GERD  Hoarse at times  Previous epigastric/GI pain in the abdomen have almost resolved  F/u LS and thoracic back pain (since 33 y.o [facility] more intense); better after Dr Michaelle CopasSmith's treatments - better  Wt Readings from Last 3 Encounters:  01/16/14 116 lb (52.617 kg)  11/27/13 117 lb (53.071 kg)  08/14/13 117 lb (53.071 kg)   BP Readings from Last 3 Encounters:  01/16/14 94/78  11/27/13 98/76  08/14/13 108/80      HPI   Review of Systems  Constitutional: Negative for chills, activity change, appetite change, fatigue and unexpected weight change.  HENT: Positive for voice change. Negative for congestion, mouth sores, nosebleeds, rhinorrhea, sinus pressure, sneezing, sore throat and trouble swallowing.        Hoarsness x 6 months  Eyes: Negative for visual disturbance.  Respiratory: Negative for cough and chest tightness.   Gastrointestinal: Positive for abdominal pain. Negative for nausea and vomiting.  Genitourinary: Positive for vaginal pain and pelvic pain. Negative for urgency, flank pain, vaginal bleeding, vaginal discharge, difficulty urinating and menstrual problem.  Musculoskeletal: Positive for back pain (better). Negative for arthralgias and gait problem.  Skin: Negative for pallor and rash.  Neurological: Negative for dizziness, tremors and syncope.  Hematological: Does not bruise/bleed easily.  Psychiatric/Behavioral: Negative for suicidal ideas, behavioral problems, confusion, sleep disturbance and dysphoric mood. The patient is not nervous/anxious and is not hyperactive.         Objective:   Physical Exam  Constitutional: She appears well-developed. No distress.  HENT:  Head: Normocephalic.  Right Ear: External ear normal.  Left Ear: External ear normal.  Nose: Nose normal.  Mouth/Throat: Oropharynx is clear and moist. No oropharyngeal exudate.  Dry mouth, a little hoarse  Eyes: Conjunctivae are normal. Pupils are equal, round, and reactive to light. Right eye exhibits no discharge. Left eye exhibits no discharge.  Neck: Normal range of motion. Neck supple. No JVD present. No tracheal deviation present. No thyromegaly present.  Cardiovascular: Normal rate, regular rhythm and normal heart sounds.   Pulmonary/Chest: No stridor. No respiratory distress. She has no wheezes.  Abdominal: Soft. Bowel sounds are normal. She exhibits no distension and no mass. There is tenderness (LLQ is sensitive). There is no rebound and no guarding.  Sensitive in RLQ and below umbilicus - mild No mass  Genitourinary:  NE  Musculoskeletal: She exhibits no edema and no tenderness.     Lymphadenopathy:    She has no cervical adenopathy.  Neurological: She displays normal reflexes. No cranial nerve deficit. She exhibits normal muscle tone. Coordination normal.  Skin: No rash noted. No erythema.  Psychiatric: She has a normal mood and affect. Her behavior is normal. Judgment and thought content normal.    Lab Results  Component Value Date   WBC 5.1 11/27/2013   HGB 12.5 11/27/2013   HCT 37.7 11/27/2013   PLT 180.0 11/27/2013   GLUCOSE 88 11/27/2013   CHOL 196 05/10/2013   TRIG 68.0 05/10/2013   HDL 62.30 05/10/2013   LDLCALC 120* 05/10/2013   ALT 20 05/10/2013  AST 23 05/10/2013   NA 136 11/27/2013   K 4.5 11/27/2013   CL 104 11/27/2013   CREATININE 0.9 11/27/2013   BUN 16 11/27/2013   CO2 27 11/27/2013   TSH 2.93 05/10/2013        Assessment & Plan:

## 2014-01-16 NOTE — Assessment & Plan Note (Signed)
Better  

## 2014-01-16 NOTE — Assessment & Plan Note (Addendum)
  H pylori and other labs

## 2014-01-16 NOTE — Assessment & Plan Note (Signed)
Voice rest 

## 2014-01-17 LAB — ALLERGY PROFILE REGION II-DC, DE, MD, ~~LOC~~, VA
Allergen, D pternoyssinus,d7: 2.58 kU/L — ABNORMAL HIGH
Alternaria Alternata: <0.1 kU/L
Aspergillus fumigatus, m3: <0.1 kU/L
Bermuda Grass: 2.19 kU/L — ABNORMAL HIGH
Box Elder IgE: 0.11 kU/L — ABNORMAL HIGH
COMMON RAGWEED: 0.17 kU/L — AB
Cat Dander: 24.6 kU/L — ABNORMAL HIGH
Cockroach: 0.51 kU/L — ABNORMAL HIGH
D. farinae: 4.64 kU/L — ABNORMAL HIGH
Dog Dander: 1.06 kU/L — ABNORMAL HIGH
ELM IGE: 0.13 kU/L — AB
JOHNSON GRASS: 3.35 kU/L — AB
Lamb's Quarters: 0.19 kU/L — ABNORMAL HIGH
MEADOW GRASS: 9.54 kU/L — AB
OAK CLASS: 0.11 kU/L — AB
Pecan/Hickory Tree IgE: <0.1 kU/L

## 2014-01-17 LAB — VITAMIN D 25 HYDROXY (VIT D DEFICIENCY, FRACTURES): Vit D, 25-Hydroxy: 39 ng/mL (ref 30–89)

## 2014-01-17 LAB — ALLERGEN FOOD PROFILE SPECIFIC IGE
CORN: 0.11 kU/L — AB
Fish Cod: <0.1 kU/L
IgE (Immunoglobulin E), Serum: 185 IU/mL — ABNORMAL HIGH (ref 0.0–180.0)
Milk IgE: <0.1 kU/L
Orange: <0.1 kU/L
Peanut IgE: <0.1 kU/L
Soybean IgE: <0.1 kU/L
Tuna IgE: <0.1 kU/L
Wheat IgE: 1 kU/L — ABNORMAL HIGH

## 2014-01-17 LAB — H. PYLORI ANTIBODY, IGG: H Pylori IgG: 0.56 {ISR}

## 2014-02-12 ENCOUNTER — Ambulatory Visit: Payer: BC Managed Care – PPO | Admitting: Internal Medicine

## 2014-02-14 ENCOUNTER — Ambulatory Visit: Payer: BC Managed Care – PPO | Admitting: Internal Medicine

## 2014-03-04 IMAGING — CR DG THORACIC SPINE 2V
3 series · 3 of 3 positions shown · non-contrast
Comparison: None.

CLINICAL DATA: Thoracic back pain

THORACIC SPINE - 2 VIEW

[view not recorded (1 of 3)]
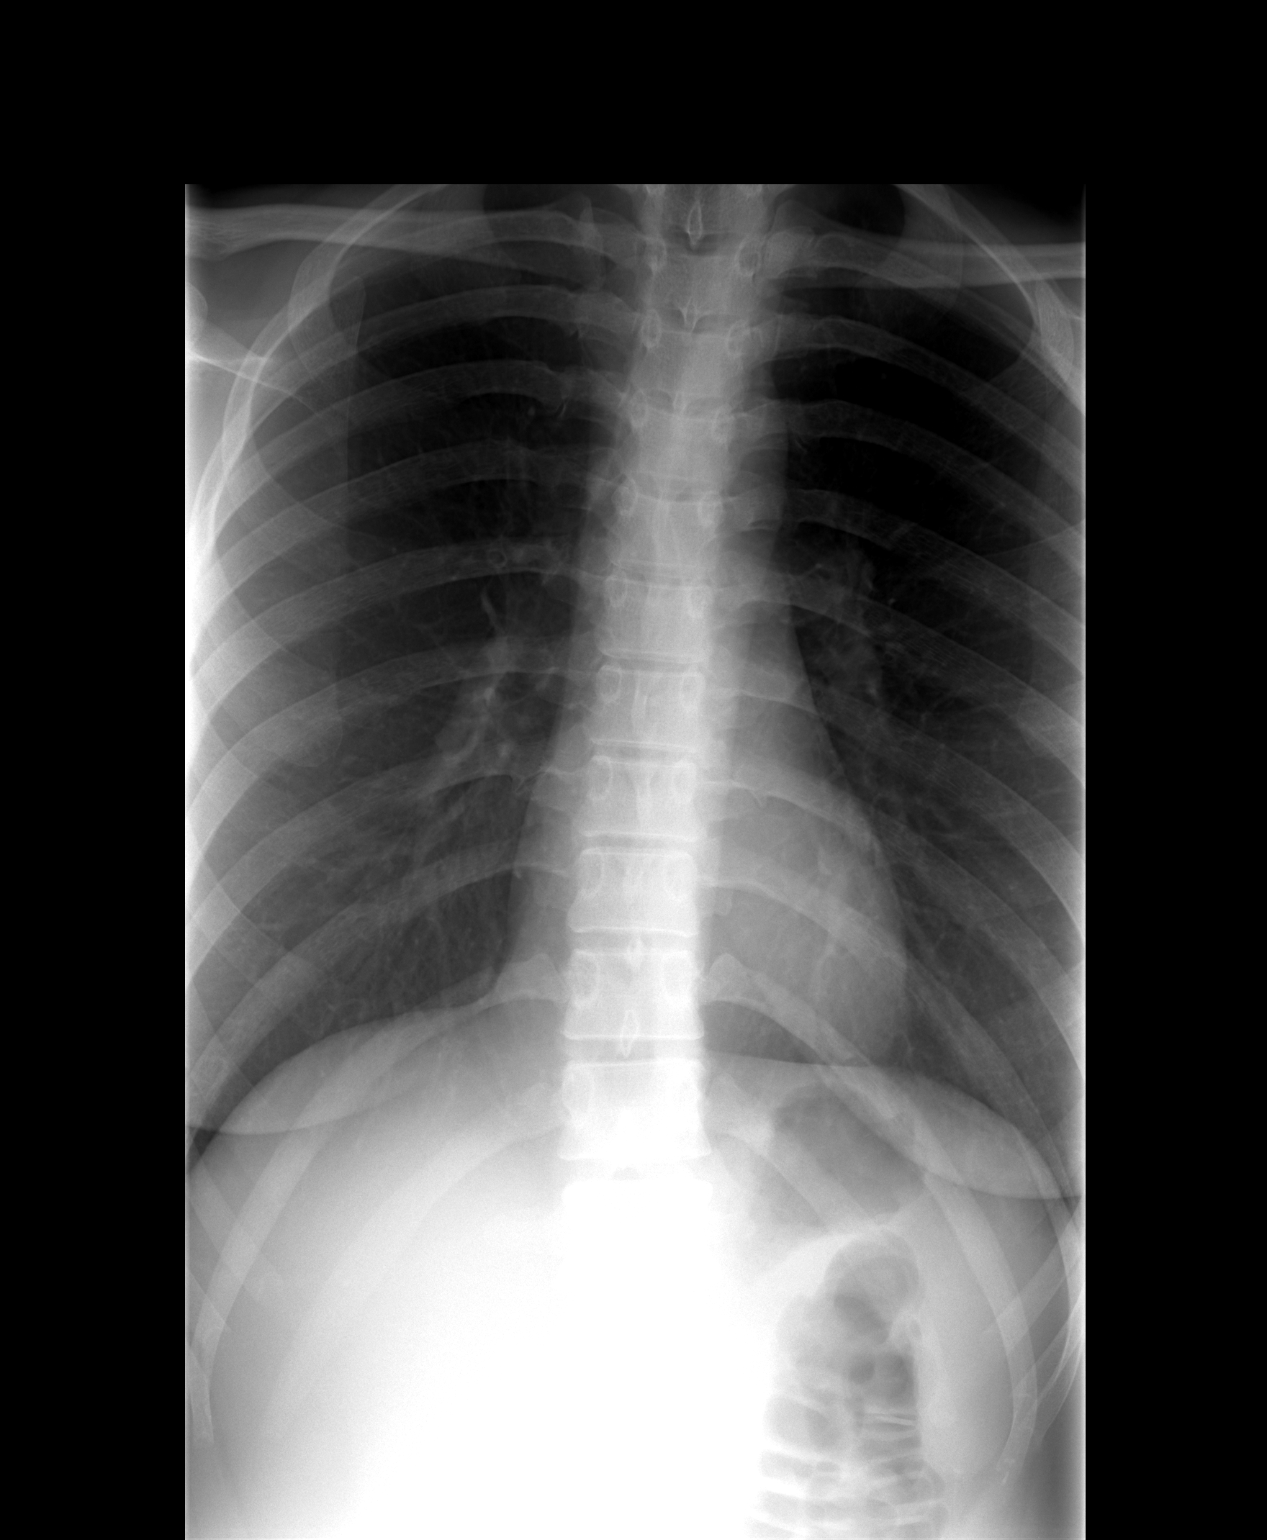

[view not recorded (2 of 3)]
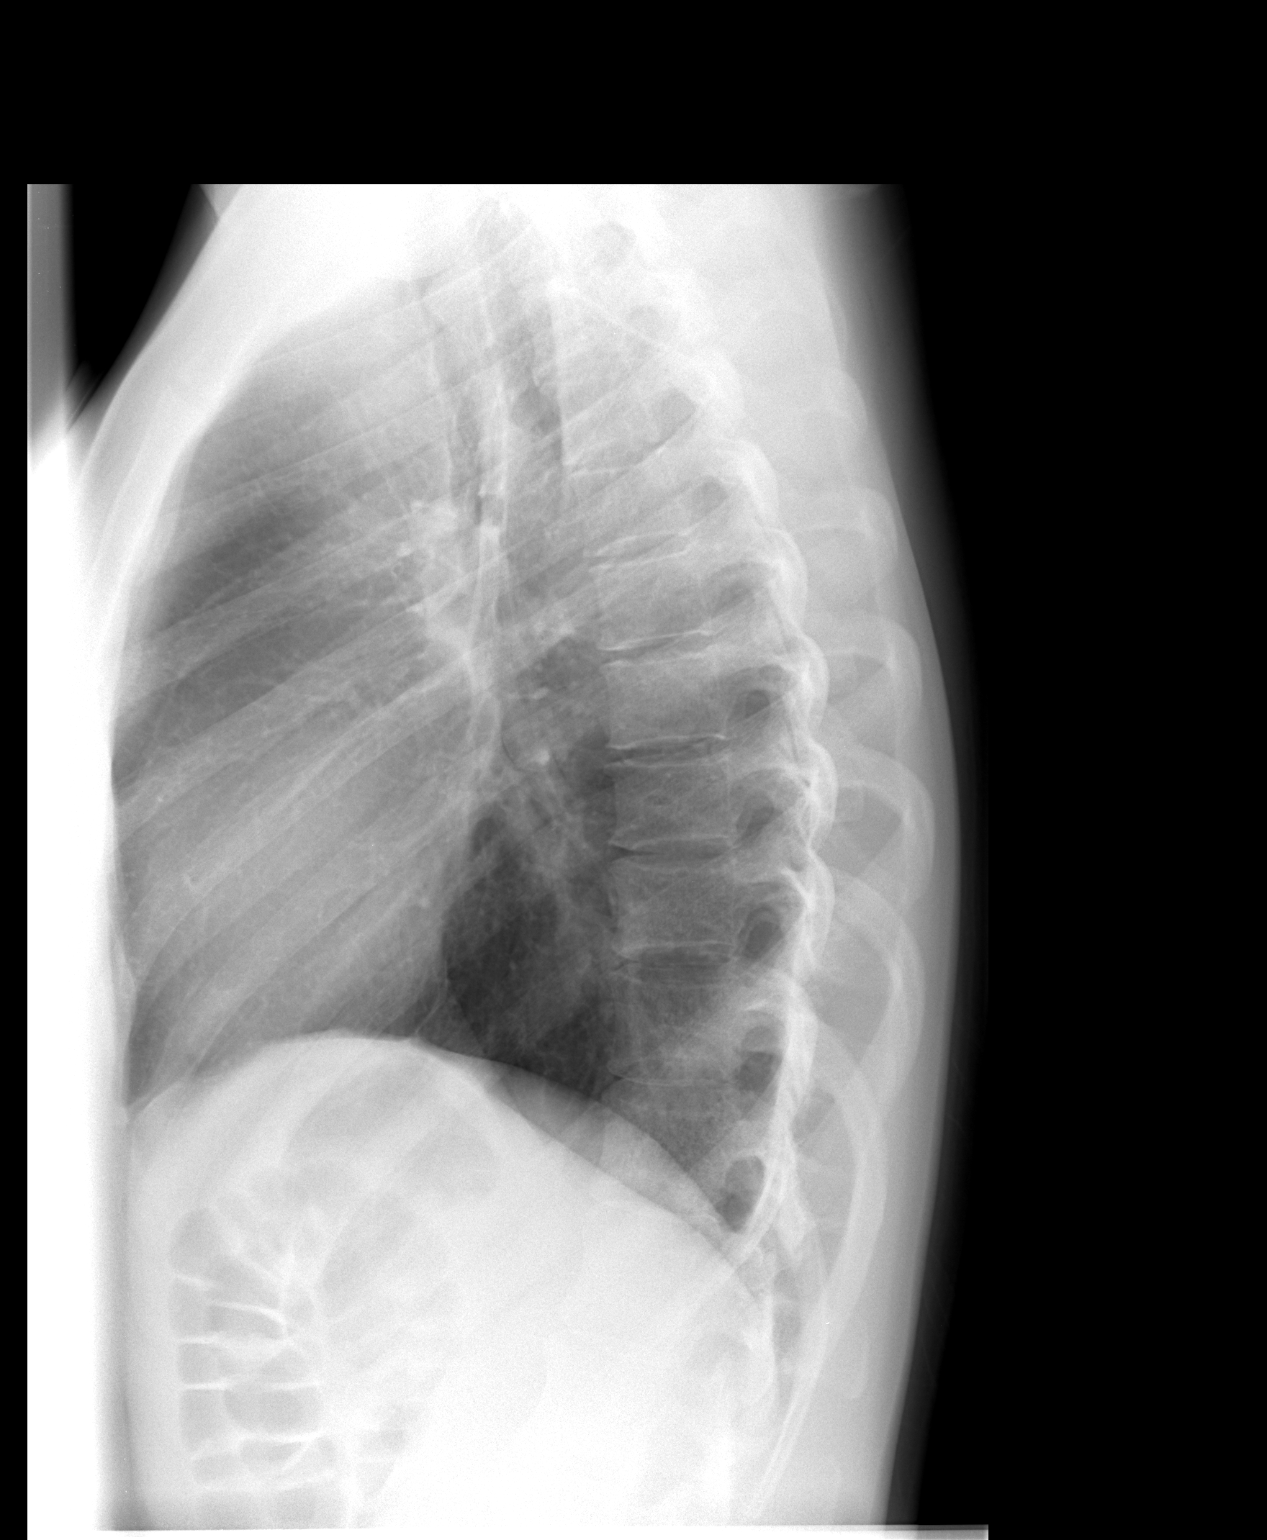

[view not recorded (3 of 3)]
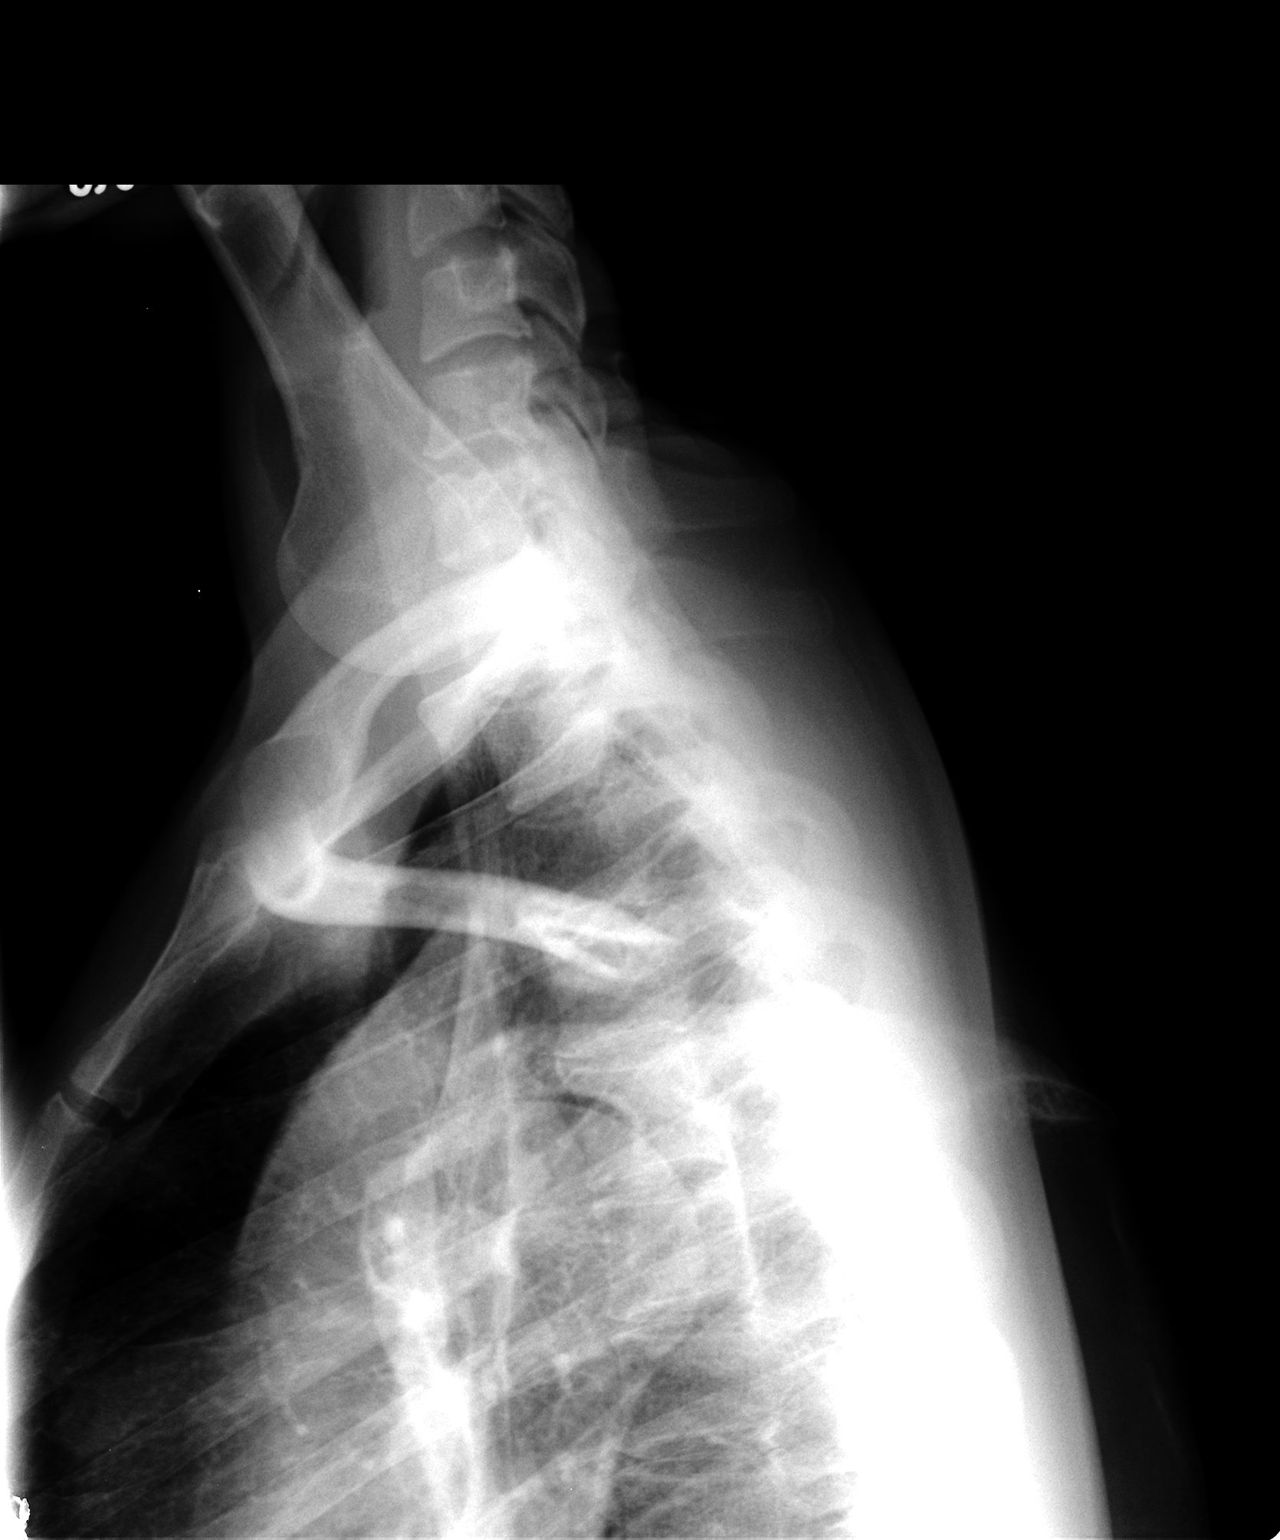

[3 of 3 positions shown; findings below may reference images not displayed]

FINDINGS: No vertebral compression deformity.  There is anatomic
alignment on the lateral view.  There is slight dextroscoliosis at
the thoracolumbar junction and slight levoscoliosis in the upper
thoracic spine.  Mild degenerative disc disease scattered
throughout the thoracic spine.
IMPRESSION: No acute bony pathology.  Mild chronic changes.

## 2014-03-27 ENCOUNTER — Other Ambulatory Visit: Payer: Self-pay | Admitting: Internal Medicine

## 2014-03-27 DIAGNOSIS — Z7721 Contact with and (suspected) exposure to potentially hazardous body fluids: Secondary | ICD-10-CM

## 2014-04-23 ENCOUNTER — Ambulatory Visit (INDEPENDENT_AMBULATORY_CARE_PROVIDER_SITE_OTHER): Payer: BC Managed Care – PPO | Admitting: Internal Medicine

## 2014-04-23 ENCOUNTER — Encounter: Payer: Self-pay | Admitting: Internal Medicine

## 2014-04-23 VITALS — BP 114/80 | HR 61 | Temp 97.8°F | Wt 117.0 lb

## 2014-04-23 DIAGNOSIS — M545 Low back pain, unspecified: Secondary | ICD-10-CM

## 2014-04-23 DIAGNOSIS — Z202 Contact with and (suspected) exposure to infections with a predominantly sexual mode of transmission: Secondary | ICD-10-CM | POA: Insufficient documentation

## 2014-04-23 DIAGNOSIS — Z20828 Contact with and (suspected) exposure to other viral communicable diseases: Secondary | ICD-10-CM

## 2014-04-23 DIAGNOSIS — R102 Pelvic and perineal pain: Secondary | ICD-10-CM

## 2014-04-23 DIAGNOSIS — N949 Unspecified condition associated with female genital organs and menstrual cycle: Secondary | ICD-10-CM

## 2014-04-23 DIAGNOSIS — R109 Unspecified abdominal pain: Secondary | ICD-10-CM

## 2014-04-23 DIAGNOSIS — M546 Pain in thoracic spine: Secondary | ICD-10-CM

## 2014-04-23 DIAGNOSIS — Z8619 Personal history of other infectious and parasitic diseases: Secondary | ICD-10-CM | POA: Insufficient documentation

## 2014-04-23 MED ORDER — VITAMIN D 1000 UNITS PO TABS: 1000.0000 [IU] | ORAL_TABLET | Freq: Every day | ORAL | Status: AC

## 2014-04-23 NOTE — Progress Notes (Signed)
Subjective:   F/u recurrent pelvic pain x2 years off and on L>R - better on off wheat diet  Married x 4 years. Living together w/her husband x 4 years. Pain w/intercourse x2 years off and on - better. No fever. No sweats. 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 Pain is much better after Doxy x 3 wks and off wheat. She s/p recent nl transvaginal pelvic US and GYN evaluation. F/u occ GERD  Hoarse at times  Pt is worried about husband's dx of gen warts She had a recent painful rash in mouth and on lip - resolved  Previous epigastric/GI pain in the abdomen have almost resolved  F/u LS and thoracic back pain (since 33 y.o [facility] more intense); better after Dr Michaelle CopasSmith's treatments - better  Wt Readings from Last 3 Encounters:  04/23/14 117 lb (53.071 kg)  01/16/14 116 lb (52.617 kg)  11/27/13 117 lb (53.071 kg)   BP Readings from Last 3 Encounters:  04/23/14 114/80  01/16/14 94/78  11/27/13 98/76      HPI   Review of Systems  Constitutional: Negative for chills, activity change, appetite change, fatigue and unexpected weight change.  HENT: Positive for voice change. Negative for congestion, mouth sores, nosebleeds, rhinorrhea, sinus pressure, sneezing, sore throat and trouble swallowing.        Hoarsness x 6 months  Eyes: Negative for visual disturbance.  Respiratory: Negative for cough and chest tightness.   Gastrointestinal: Negative for nausea, vomiting and abdominal pain.  Genitourinary: Negative for urgency, flank pain, vaginal bleeding, vaginal discharge, difficulty urinating, vaginal pain, menstrual problem and pelvic pain.  Musculoskeletal: Positive for back pain (better). Negative for arthralgias and gait problem.  Skin: Negative for pallor and rash.  Neurological: Negative for dizziness, tremors and syncope.  Hematological: Does not bruise/bleed easily.  Psychiatric/Behavioral: Negative for suicidal ideas,  behavioral problems, confusion, sleep disturbance and dysphoric mood. The patient is not nervous/anxious and is not hyperactive.        Objective:   Physical Exam  Constitutional: She appears well-developed. No distress.  HENT:  Head: Normocephalic.  Right Ear: External ear normal.  Left Ear: External ear normal.  Nose: Nose normal.  Mouth/Throat: Oropharynx is clear and moist. No oropharyngeal exudate.  Dry mouth, a little hoarse  Eyes: Conjunctivae are normal. Pupils are equal, round, and reactive to light. Right eye exhibits no discharge. Left eye exhibits no discharge.  Neck: Normal range of motion. Neck supple. No JVD present. No tracheal deviation present. No thyromegaly present.  Cardiovascular: Normal rate, regular rhythm and normal heart sounds.   Pulmonary/Chest: No stridor. No respiratory distress. She has no wheezes.  Abdominal: Soft. Bowel sounds are normal. She exhibits no distension and no mass. There is no tenderness. There is no rebound and no guarding.  NT No mass  Genitourinary:  NE  Musculoskeletal: She exhibits no edema and no tenderness.     Lymphadenopathy:    She has no cervical adenopathy.  Neurological: She displays normal reflexes. No cranial nerve deficit. She exhibits normal muscle tone. Coordination normal.  Skin: No rash noted. No erythema.  Psychiatric: She has a normal mood and affect. Her behavior is normal. Judgment and thought content normal.    Lab Results  Component Value Date   WBC 4.7 01/16/2014   HGB 12.5 01/16/2014   HCT 37.3 01/16/2014   PLT 173.0 01/16/2014   GLUCOSE 88 11/27/2013   CHOL 196 05/10/2013  TRIG 68.0 05/10/2013   HDL 62.30 05/10/2013   LDLCALC 120* 05/10/2013   ALT 33 01/16/2014   AST 42* 01/16/2014   NA 136 11/27/2013   K 4.5 11/27/2013   CL 104 11/27/2013   CREATININE 0.9 11/27/2013   BUN 16 11/27/2013   CO2 27 11/27/2013   TSH 2.88 01/16/2014        Assessment & Plan:

## 2014-04-23 NOTE — Assessment & Plan Note (Signed)
04/2014 recent 1st time w/stomatitis Discussed

## 2014-04-23 NOTE — Assessment & Plan Note (Signed)
Doing better off wheat

## 2014-04-23 NOTE — Assessment & Plan Note (Signed)
8/15 husband w/gen warts Discussed potential implications w/pt Labs per pt's request Info given GYN appt this fall

## 2014-04-23 NOTE — Patient Instructions (Addendum)
HPV infection Genital warts HPV tests  Cold sores - Herpes simplex virus

## 2014-04-23 NOTE — Assessment & Plan Note (Signed)
Doing well 

## 2014-04-23 NOTE — Progress Notes (Signed)
Pre visit review using our clinic review tool, if applicable. No additional management support is needed unless otherwise documented below in the visit note. 

## 2014-04-23 NOTE — Assessment & Plan Note (Signed)
Better  

## 2014-04-23 NOTE — Assessment & Plan Note (Signed)
Doing better off wheat  

## 2014-08-25 ENCOUNTER — Ambulatory Visit: Payer: BC Managed Care – PPO | Admitting: Internal Medicine
